# Patient Record
Sex: Male | Born: 1965 | Race: White | Hispanic: No | Marital: Married | Smoking: Current every day smoker
Health system: Southern US, Community
[De-identification: ages and names within clinical notes are randomized; demographics above are authoritative.]

## PROBLEM LIST (undated history)

## (undated) DIAGNOSIS — Z8679 Personal history of other diseases of the circulatory system: Secondary | ICD-10-CM

## (undated) DIAGNOSIS — Q752 Hypertelorism: Secondary | ICD-10-CM

## (undated) DIAGNOSIS — R079 Chest pain, unspecified: Secondary | ICD-10-CM

## (undated) DIAGNOSIS — R9431 Abnormal electrocardiogram [ECG] [EKG]: Secondary | ICD-10-CM

## (undated) DIAGNOSIS — F17200 Nicotine dependence, unspecified, uncomplicated: Secondary | ICD-10-CM

## (undated) DIAGNOSIS — R238 Other skin changes: Secondary | ICD-10-CM

## (undated) DIAGNOSIS — E785 Hyperlipidemia, unspecified: Secondary | ICD-10-CM

## (undated) HISTORY — PX: CARDIAC CATHETERIZATION: SHX172

## (undated) HISTORY — DX: Hypertelorism: Q75.2

## (undated) HISTORY — DX: Other skin changes: R23.8

## (undated) HISTORY — DX: Abnormal electrocardiogram (ECG) (EKG): R94.31

## (undated) HISTORY — DX: Chest pain, unspecified: R07.9

## (undated) HISTORY — DX: Hyperlipidemia, unspecified: E78.5

## (undated) HISTORY — DX: Personal history of other diseases of the circulatory system: Z86.79

## (undated) HISTORY — DX: Nicotine dependence, unspecified, uncomplicated: F17.200

---

## 2012-11-11 ENCOUNTER — Other Ambulatory Visit (HOSPITAL_COMMUNITY): Payer: Self-pay | Admitting: Internal Medicine

## 2012-11-11 DIAGNOSIS — I25810 Atherosclerosis of coronary artery bypass graft(s) without angina pectoris: Secondary | ICD-10-CM

## 2012-11-11 DIAGNOSIS — I10 Essential (primary) hypertension: Secondary | ICD-10-CM

## 2012-11-11 DIAGNOSIS — R079 Chest pain, unspecified: Secondary | ICD-10-CM

## 2012-11-21 ENCOUNTER — Ambulatory Visit (HOSPITAL_COMMUNITY)
Admission: RE | Admit: 2012-11-21 | Discharge: 2012-11-21 | Disposition: A | Discharge status: Home / Self Care | Payer: BC Managed Care – PPO | Source: Ambulatory Visit | Attending: Internal Medicine | Admitting: Internal Medicine

## 2012-11-21 DIAGNOSIS — I2510 Atherosclerotic heart disease of native coronary artery without angina pectoris: Secondary | ICD-10-CM | POA: Insufficient documentation

## 2012-11-21 DIAGNOSIS — R079 Chest pain, unspecified: Secondary | ICD-10-CM

## 2012-11-21 DIAGNOSIS — I252 Old myocardial infarction: Secondary | ICD-10-CM | POA: Insufficient documentation

## 2012-11-21 DIAGNOSIS — I25810 Atherosclerosis of coronary artery bypass graft(s) without angina pectoris: Secondary | ICD-10-CM

## 2012-11-21 DIAGNOSIS — R5381 Other malaise: Secondary | ICD-10-CM | POA: Insufficient documentation

## 2012-11-21 DIAGNOSIS — I10 Essential (primary) hypertension: Secondary | ICD-10-CM

## 2012-11-21 DIAGNOSIS — Z9861 Coronary angioplasty status: Secondary | ICD-10-CM | POA: Insufficient documentation

## 2012-11-21 MED ORDER — REGADENOSON 0.4 MG/5ML IV SOLN
0.4000 mg | Freq: Once | INTRAVENOUS | Status: AC
  Administered 2012-11-21: 0.4 mg via INTRAVENOUS

## 2012-11-21 MED ORDER — TECHNETIUM TC 99M SESTAMIBI GENERIC - CARDIOLITE
30.7000 | Freq: Once | INTRAVENOUS | Status: AC | PRN
  Administered 2012-11-21: 30.7 via INTRAVENOUS

## 2012-11-21 MED ORDER — TECHNETIUM TC 99M SESTAMIBI GENERIC - CARDIOLITE
10.2000 | Freq: Once | INTRAVENOUS | Status: AC | PRN
  Administered 2012-11-21: 10 via INTRAVENOUS

## 2012-11-21 NOTE — Procedures (Addendum)
 [facility] [address] [place] [address] [phone]  Cardiology Nuclear Med Study  Rodney Acosta is a 47 y.o. male     MRN : [id number]     DOB: 05/19/1966  Procedure Date: 11/21/2012  Nuclear Med Background Indication for Stress Test:  Stent Patency History:  CAD;MI-2002;STENT/PTCS-2002;CARDIOMYOPATHY Cardiac Risk Factors: Family History - CAD, Hypertension, Lipids, Overweight and Smoker  Symptoms:  Chest Pain and Fatigue   Nuclear Pre-Procedure Caffeine/Decaff Intake:  10:00pm NPO After: 8:00am   IV Site: R Hand  IV 0.9% NS with Angio Cath:  22g  Chest Size (in):  46" IV Started by: Emmit Pomfret, RN  Height: 5\' 10"  (1.778 m)  Cup Size: n/a  BMI:  Body mass index is 33.72 kg/(m^2). Weight:  235 lb (106.595 kg)   Tech Comments:  N/A    Nuclear Med Study 1 or 2 day study: 1 day  Stress Test Type:  Lexiscan  Order Authorizing Provider:  KENNETH [place] HILTY, MD   Resting Radionuclide: Technetium 75m Sestamibi  Resting Radionuclide Dose: 10.2 mCi   Stress Radionuclide:  Technetium 35m Sestamibi  Stress Radionuclide Dose: 30.7 mCi           Stress Protocol Rest HR: 88 Stress HR: 104  Rest BP: 137/86 Stress BP: 142/86  Exercise Time (min): n/a METS: n/a   Predicted Max HR: 172 bpm % Max HR: 51.16 bpm Rate Pressure Product: 41324  Dose of Adenosine (mg):  n/a Dose of Lexiscan: 0.4 mg  Dose of Atropine (mg): n/a Dose of Dobutamine: n/a mcg/kg/min (at max HR)  Stress Test Technologist: Esperanza Sheets, CCT Nuclear Technologist: Gonzella Lex, CNMT   Rest Procedure:  Myocardial perfusion imaging was performed at rest 45 minutes following the intravenous administration of Technetium 51m Sestamibi. Stress Procedure:  The patient received IV Lexiscan 0.4 mg over 15-seconds.  Technetium 30m Sestamibi injected at 30-seconds.  There were no significant changes with Lexiscan.  Quantitative spect images were obtained  after a 45 minute delay.  Transient Ischemic Dilatation (Normal <1.22):  1.14 Lung/Heart Ratio (Normal <0.45):  0.28 QGS EDV:  n/a ml QGS ESV:  n/a ml LV Ejection Fraction: Study not gated  Signed by       Rest ECG: NSR, frequent monomorphic PVCs.  Stress ECG: No significant change from baseline ECG  QPS Raw Data Images:  Normal; no motion artifact; normal heart/lung ratio. Stress Images:  There is decreased uptake in the inferior wall. Rest Images:  Minimal improvement in inferior wall uptake. Subtraction (SDS):  There is a fixed defect that is most consistent with a previous infarction.  Impression Exercise Capacity:  Lexiscan with no exercise. BP Response:  Normal blood pressure response. Clinical Symptoms:  No significant symptoms noted. ECG Impression:  No significant ST segment change suggestive of ischemia. Comparison with Prior Nuclear Study: No previous nuclear study performed  Overall Impression:  Low risk stress nuclear study. In the absence of gated images, cnnot exclude diaphragmatic attenuation artifact, but the images suggest true nontransmural scar in the RCA territory.  LV Wall Motion:  The study was not gated due to ectopy.   Thurmon Fair, MD  11/21/2012 6:48 PM

## 2014-03-10 ENCOUNTER — Other Ambulatory Visit: Payer: Self-pay

## 2014-03-10 ENCOUNTER — Ambulatory Visit (INDEPENDENT_AMBULATORY_CARE_PROVIDER_SITE_OTHER): Payer: BC Managed Care – PPO | Admitting: Cardiology

## 2014-03-10 ENCOUNTER — Encounter: Payer: Self-pay | Admitting: Cardiology

## 2014-03-10 VITALS — BP 130/70 | HR 99 | Ht 70.0 in | Wt 223.0 lb

## 2014-03-10 DIAGNOSIS — Z01810 Encounter for preprocedural cardiovascular examination: Secondary | ICD-10-CM | POA: Insufficient documentation

## 2014-03-10 DIAGNOSIS — Z720 Tobacco use: Secondary | ICD-10-CM

## 2014-03-10 DIAGNOSIS — I10 Essential (primary) hypertension: Secondary | ICD-10-CM

## 2014-03-10 DIAGNOSIS — I2510 Atherosclerotic heart disease of native coronary artery without angina pectoris: Secondary | ICD-10-CM | POA: Insufficient documentation

## 2014-03-10 DIAGNOSIS — F17200 Nicotine dependence, unspecified, uncomplicated: Secondary | ICD-10-CM

## 2014-03-10 MED ORDER — ASPIRIN EC 81 MG PO TBEC
81.0000 mg | DELAYED_RELEASE_TABLET | Freq: Every day | ORAL | Status: AC
Start: 2014-03-10 — End: ?

## 2014-03-10 MED ORDER — METOPROLOL SUCCINATE ER 25 MG PO TB24: 25.0000 mg | ORAL_TABLET | Freq: Every day | ORAL | Status: AC

## 2014-03-10 NOTE — Patient Instructions (Signed)
Start taking Toprol XL 25 mg once daily. Take Asprin, 81 mg once daily. Return in 6 months for follow-up with Dr. Rennis GoldenHilty.

## 2014-03-10 NOTE — Progress Notes (Signed)
Patient ID: Rodney Acosta, male   DOB: 07/12/1966, 48 y.o.   MRN: [id number]    03/10/2014 Rodney Acosta   03/15/1966  [id number]  Primary Physicia STEVENS, Lambert KetoLISA LAJUANA, PA Primary Cardiologist: Dr. Rennis GoldenHilty  HPI:  Mr. Rodney Acosta presents to clinic today for preoperative clearance. He is tentatively scheduled to undergo an elective ventral hernia repair [facility].   He is a 48 year old male, followed by Dr. Rennis GoldenHilty. He has a history of known CAD, ongoing tobacco abuse (smokes 1.5 ppd), hypertension and hyperlipidemia. In 2002, he suffered an inferior ST elevation MI, due to a mid and distal RCA occlusion, successfully treated with PCI and stenting. He had a nuclear stress test in March of 2014 which demonstrated no ischemia. He denies any recurrent anginal symptoms. 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 He states he can perform his work duties without limitation. He states that he is able to walk a flight of stairs without exertional angina or dyspnea. He denies dizziness, lightheadedness, palpitations, syncope/near-syncope, lower extremity edema, orthopnea or PND. Unfortunately, he continues to smoke daily. He also reports that he has not been compliant with his medications. He stopped taking his metoprolol, as he felt like he no longer needed it. He states that he gets regular checkups at his place of employment. There is a PA/NP that is available on a weekly basis for routine checkups. The providers at his place of employment serve as his PCP. He states that they monitor his cholesterol and blood pressure on a regular basis. He states that both his blood pressure and cholesterol have been well controlled. He has also been screened for diabetes and denies any past diagnosis of this.  His EKG today demonstrates normal sinus rhythm without ischemic changes. Heart rate is 99 beats per minute. His blood pressure is controlled at  130/90.  Current Outpatient Prescriptions  Medication Sig Dispense Refill  . aspirin EC 81 MG tablet Take 1 tablet (81 mg total) by mouth daily.  90 tablet  3  . metoprolol succinate (TOPROL XL) 25 MG 24 hr tablet Take 1 tablet (25 mg total) by mouth daily.  30 tablet  5   No current facility-administered medications for this visit.    No Known Allergies  History   Social History  . Marital Status: Married    Spouse Name: N/A    Number of Children: N/A  . Years of Education: N/A   Occupational History  . Not on file.   Social History Main Topics  . Smoking status: Current Every Day Smoker  . Smokeless tobacco: Not on file  . Alcohol Use: Yes  . Drug Use: No  . Sexual Activity: Not on file   Other Topics Concern  . Not on file   Social History Narrative  . No narrative on file     Review of Systems: General: negative for chills, fever, night sweats or weight changes.  Cardiovascular: negative for chest pain, dyspnea on exertion, edema, orthopnea, palpitations, paroxysmal nocturnal dyspnea or shortness of breath Dermatological: negative for rash Respiratory: negative for cough or wheezing Urologic: negative for hematuria Abdominal: negative for nausea, vomiting, diarrhea, bright red blood per rectum, melena, or hematemesis Neurologic: negative for visual changes, syncope, or dizziness All other systems reviewed and are otherwise negative except as noted above.    Blood pressure 130/70, pulse 99, height 5\' 10"  (1.778 m), weight 223 lb (101.152 kg).  General appearance: alert, cooperative  and no distress Neck: no carotid bruit and no JVD Lungs: clear to auscultation bilaterally Heart: regular rate and rhythm, S1, S2 normal, no murmur, click, rub or gallop Extremities: no LEE Pulses: 2+ and symmetric Skin: warm and dry Neurologic: Grossly normal  EKG: normal sinus rhythm. Ventricular rate 99 beats per minute.  ASSESSMENT AND PLAN:   1. CAD: Status post  inferior STEMI in 2002 resulting in PCI plus stenting of the RCA. Last nuclear stress test was in March 2014 which was nonischemic. His EKG today demonstrates normal sinus rhythm without any ischemic abnormalities. He has  not had any anginal symptoms or shortness of breath and he is able to perform at least 4 METs without limitations. He has been instructed to continue daily low dose aspirin, however will leave decision to his surgeon in regards to whether or not it should be continued during the perioperative period. As his heart rate is elevated and he has a history of prior MI, will restart him on his beta blocker. Will initiate 25 mg of Toprol XL. Smoking cessation was strongly advised.  2. Hypertension: Blood pressure is controlled at 130/90. He is currently not on a beta blocker. Will start 25 mg of Toprol XL, to be taken once daily.  3. Hyperlipidemia: This is followed by his PCP.  4. Chronic tobacco abuse: He smokes on average 1.5 ppd. Smoking cessation was strongly advised   PLAN: I have discussed case with Dr. Rennis Golden. He has reviewed his charts and EKG. He appears stable from a cardiac standpoint. As he has not had any recurrent anginal symptoms and is able to perform at least 4 METs without limitation, he has been cleared to undergo elective ventral hernia repair. It was recommended that we restart him a beta blocker. This was e-prescribed to his pharmacy. He is also to resume aspirin, however this may be temporarily held for surgery.  Rodney Acosta, BRITTAINYPA-C 03/10/2014 5:13 PM

## 2014-03-25 ENCOUNTER — Ambulatory Visit: Payer: BC Managed Care – PPO | Admitting: Cardiology

## 2015-04-16 ENCOUNTER — Encounter: Payer: Self-pay | Admitting: Internal Medicine

## 2015-05-23 NOTE — Progress Notes (Signed)
Cardiology Office Note   Date:  05/24/2015   ID:  Rodney Acosta, DOB Dec 07, 1965, MRN [id number]  PCP:  Dow Adolph, PA  Cardiologist:   Madilyn Hook, MD   Chief Complaint  Patient presents with  . New Evaluation    abnormal elk/SOB and sweaty      History of Present Illness: Rodney Acosta is a 49 y.o. male with CAD s/p MI, hypertension, hyperlipidemia and tobacco abuse who presents for an evaluation of chest pain.  At the end of September  He saw the PA at his work and reported intermittent episodes of chest pain.  These episodes occur when laying in the [place] and last for a few seconds.  There is no associated shortness of breath, nausea or diaphoresis.  His PA referred him to urgent care where an EKG was performed and was reportedly abnormal. From there he was referred to the [facility] ER and had two sets of negative cardiac enzymes.  He was discharged from the ED and it was recommended that he get a stress test as an outpatient.  Since then he has not had any recurrent episodes.  Rodney Acosta also reports dizziness, shortness of breath and diaphoresis for the last 2 days. This occurred at rest. It did not get worse with physical activity such as walking. His wife associates these symptoms with him working third shift. She states these only happen when he is working this on shift. When he works third shift it is difficult for him to sleep.  Rodney Acosta most strenuous activity as yardwork. He does not get any chest pain or shortness of breath with this activity. He denies a orthopnea, lower extremity edema, PND, palpitations, lightheadedness, or dizziness. He notes that his diet is poor. He does not eat any breakfast and only has small snack for lunch. He drinks coffee and soda occasionally throughout the day. He does not drink any water. He continues to smoke one pack per day. He was previously able to quit smoking with the help of Chantix. That lasted for 2  years. However he started gaining weight and decided to start back smoking.  Rodney Acosta had a Lexiscan Myoview in 11/2012 that was negative for ischemia.  Past Medical History  Diagnosis Date  . ST segment elevation     myocardial infarction  . Hyperlineal palms   . Hypertelorism   . History of ischemic cardiomyopathy     EF of 40%  . Tobacco dependence   . Chest pain     Relieved with Nitroglycerin  . Dyslipidemia     Past Surgical History  Procedure Laterality Date  . Cardiac catheterization       Current Outpatient Prescriptions  Medication Sig Dispense Refill  . aspirin EC 81 MG tablet Take 1 tablet (81 mg total) by mouth daily. 90 tablet 3  . cyclobenzaprine (FLEXERIL) 5 MG tablet Take 5 mg by mouth as needed.  1  . metoprolol succinate (TOPROL XL) 25 MG 24 hr tablet Take 1 tablet (25 mg total) by mouth daily. 30 tablet 5  . nitroGLYCERIN (NITRODUR - DOSED IN MG/24 HR) 0.4 mg/hr patch Place 1 patch onto the skin.    Marland Kitchen nitroGLYCERIN (NITROSTAT) 0.4 MG SL tablet Place 0.4 mg under the tongue See admin instructions.  0  . rosuvastatin (CRESTOR) 20 MG tablet Take 20 mg by mouth daily.     No current facility-administered medications for this visit.    Allergies:   Review  of patient's allergies indicates no known allergies.    Social History:  The patient  reports that he has been smoking.  He does not have any smokeless tobacco history on file. He reports that he drinks alcohol. He reports that he does not use illicit drugs.   Family History:  The patient's family history includes Cancer in his sister; Heart attack in his father and maternal grandmother; Heart attack (age of onset: 43) in his brother.    ROS:  Please see the history of present illness.   Otherwise, review of systems are positive for none.   All other systems are reviewed and negative.    PHYSICAL EXAM: VS:  BP 136/88 mmHg  Pulse 86  Ht  (1.778 m)  Wt 104.327 kg (230 lb)  BMI 33.00 kg/m2   SpO2 96% , BMI Body mass index is 33 kg/(m^2). GENERAL:  Well appearing HEENT:  Pupils equal round and reactive, fundi not visualized, oral mucosa unremarkable NECK:  No jugular venous distention, waveform within normal limits, carotid upstroke brisk and symmetric, no bruits, no thyromegaly LYMPHATICS:  No cervical adenopathy LUNGS:  Clear to auscultation bilaterally HEART:  RRR.  PMI not displaced or sustained,S1 and S2 within normal limits, no S3, no S4, no clicks, no rubs, no murmurs ABD:  Flat, positive bowel sounds normal in frequency in pitch, no bruits, no rebound, no guarding, no midline pulsatile mass, no hepatomegaly, no splenomegaly EXT:  2 plus pulses throughout, no edema, no cyanosis no clubbing SKIN:  No rashes no nodules NEURO:  Cranial nerves II through XII grossly intact, motor grossly intact throughout PSYCH:  Cognitively intact, oriented to person place and time    EKG:  EKG is ordered today. The ekg ordered today demonstrates sinus rhythm at 83 bpm.  Non-specific t wave changes.     Recent Labs: No results found for requested labs within last 365 days.    Lipid Panel No results found for: CHOL, TRIG, HDL, CHOLHDL, VLDL, LDLCALC, LDLDIRECT    Wt Readings from Last 3 Encounters:  05/24/15 104.327 kg (230 lb)  03/10/14 101.152 kg (223 lb)  11/21/12 106.595 kg (235 lb)      Other studies Reviewed: Additional studies/ records that were reviewed today include: . Review of the above records demonstrates:  Please see elsewhere in the note.     ASSESSMENT AND PLAN:  # Atypical chest pain:  Symptoms are very atypical. However he has a history of coronary artery disease. We will refer him for exercise Cardiolite. He will continue aspirin, metoprolol and prn nitroglycerin.  # Hypertension: BP well-controlled.  COntinue metorpolol.  # Hyperlipidemia: Continue rosuvastatin.  Managed by PCP.  # Tobacco abuse: Rodney Acosta states that he is not ready to stop  smoking at this time.  He will consider using Chantix again one day.  # Obesity:  We discussed the need for 30-40 minutes of aerobic activity most days of the week. We also discussed options for improving his diet. Given that he does not eat breakfast or lunch, we recommended that he start his stay with a healthy breakfast. This will help his metabolism in and assist in weight loss. It will also help him to not be so hungry and make poor food decisions later in the day.  Current medicines are reviewed at length with the patient today.  The patient does not have concerns regarding medicines.  The following changes have been made:  no change  Labs/ tests ordered today include:  Orders Placed This Encounter  Procedures  . Myocardial Perfusion Imaging  . EKG 12-Lead     Disposition:   FU with Selestino Nila C. Duke Salvia, MD in 1 year.   Signed, Madilyn Hook, MD  05/24/2015 4:21 PM    [facility]

## 2015-05-24 ENCOUNTER — Ambulatory Visit (INDEPENDENT_AMBULATORY_CARE_PROVIDER_SITE_OTHER): Payer: BLUE CROSS/BLUE SHIELD | Admitting: Cardiovascular Disease

## 2015-05-24 ENCOUNTER — Encounter: Payer: Self-pay | Admitting: Cardiovascular Disease

## 2015-05-24 VITALS — BP 136/88 | HR 86 | Ht 70.0 in | Wt 230.0 lb

## 2015-05-24 DIAGNOSIS — I10 Essential (primary) hypertension: Secondary | ICD-10-CM

## 2015-05-24 DIAGNOSIS — E785 Hyperlipidemia, unspecified: Secondary | ICD-10-CM

## 2015-05-24 DIAGNOSIS — R079 Chest pain, unspecified: Secondary | ICD-10-CM | POA: Diagnosis not present

## 2015-05-24 DIAGNOSIS — I2510 Atherosclerotic heart disease of native coronary artery without angina pectoris: Secondary | ICD-10-CM | POA: Diagnosis not present

## 2015-05-24 DIAGNOSIS — Z720 Tobacco use: Secondary | ICD-10-CM

## 2015-05-24 NOTE — Patient Instructions (Signed)
Your physician has requested that you have en exercise stress myoview. For further information please visit [email or web address] Please follow instruction sheet, as given.  Dr Duke Salvia recommends that you schedule a follow-up appointment in 1 year. You will receive a reminder letter in the mail two months in advance. If you don't receive a letter, please call our office to schedule the follow-up appointment.

## 2015-05-26 ENCOUNTER — Telehealth (HOSPITAL_COMMUNITY): Payer: Self-pay

## 2015-05-26 NOTE — Telephone Encounter (Signed)
Encounter complete. 

## 2015-05-28 ENCOUNTER — Ambulatory Visit (HOSPITAL_COMMUNITY)
Admission: RE | Admit: 2015-05-28 | Discharge: 2015-05-28 | Disposition: A | Discharge status: Home / Self Care | Payer: BLUE CROSS/BLUE SHIELD | Source: Ambulatory Visit | Attending: Cardiovascular Disease | Admitting: Cardiovascular Disease

## 2015-05-28 DIAGNOSIS — R61 Generalized hyperhidrosis: Secondary | ICD-10-CM | POA: Insufficient documentation

## 2015-05-28 DIAGNOSIS — I10 Essential (primary) hypertension: Secondary | ICD-10-CM | POA: Diagnosis not present

## 2015-05-28 DIAGNOSIS — F17200 Nicotine dependence, unspecified, uncomplicated: Secondary | ICD-10-CM | POA: Diagnosis not present

## 2015-05-28 DIAGNOSIS — Z8249 Family history of ischemic heart disease and other diseases of the circulatory system: Secondary | ICD-10-CM | POA: Diagnosis not present

## 2015-05-28 DIAGNOSIS — R0602 Shortness of breath: Secondary | ICD-10-CM | POA: Diagnosis not present

## 2015-05-28 DIAGNOSIS — R079 Chest pain, unspecified: Secondary | ICD-10-CM | POA: Diagnosis not present

## 2015-05-28 DIAGNOSIS — R42 Dizziness and giddiness: Secondary | ICD-10-CM | POA: Diagnosis not present

## 2015-05-28 LAB — MYOCARDIAL PERFUSION IMAGING
CHL CUP NUCLEAR SSS: 4
CHL RATE OF PERCEIVED EXERTION: 17
CSEPED: 9 min
CSEPEW: 10.1 METS
CSEPPHR: 150 {beats}/min
MPHR: 169 {beats}/min
NUC STRESS TID: 1.05
Percent HR: 88 %
Rest HR: 85 {beats}/min
SDS: 0
SRS: 4

## 2015-05-28 MED ORDER — TECHNETIUM TC 99M SESTAMIBI GENERIC - CARDIOLITE
10.8000 | Freq: Once | INTRAVENOUS | Status: AC | PRN
  Administered 2015-05-28: 10.8 via INTRAVENOUS

## 2015-05-28 MED ORDER — TECHNETIUM TC 99M SESTAMIBI GENERIC - CARDIOLITE
30.8000 | Freq: Once | INTRAVENOUS | Status: AC | PRN
  Administered 2015-05-28: 30.8 via INTRAVENOUS

## 2015-05-31 ENCOUNTER — Telehealth: Payer: Self-pay | Admitting: *Deleted

## 2015-05-31 NOTE — Telephone Encounter (Signed)
-----   Message from Chilton Si, MD sent at 05/30/2015 11:04 AM EDT ----- Normal stress test.  Chest pain is not due to abnormal blood flow in the heart arteries.

## 2015-05-31 NOTE — Telephone Encounter (Signed)
LEFT MESSAGE TO CALL BACK

## 2015-05-31 NOTE — Telephone Encounter (Signed)
Spoke to patien'ts wife. Result given . Verbalized understanding  

## 2017-08-27 IMAGING — NM NM MISC PROCEDURE
4 series · 24 of 24 positions shown · non-contrast
Comparison: none

[Series 1: wbr rest · 6.40mm/px · 6 of 64 frames shown]
[frame 6/64]
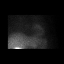
[frame 16/64]
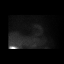
[frame 27/64]
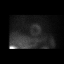
[frame 38/64]
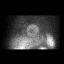
[frame 48/64]
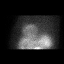
[frame 59/64]
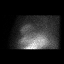

[Series 1: wbr_r-proj_st wbr rest · 6.40mm/px · 6 of 64 frames shown]
[frame 6/64]
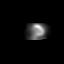
[frame 16/64]
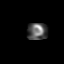
[frame 27/64]
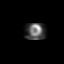
[frame 38/64]
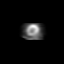
[frame 48/64]
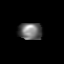
[frame 59/64]
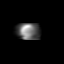

[Series 2: wbr stress ng · 6.40mm/px · 6 of 64 frames shown]
[frame 6/64]
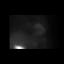
[frame 16/64]
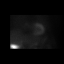
[frame 27/64]
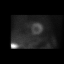
[frame 38/64]
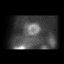
[frame 48/64]
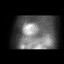
[frame 59/64]
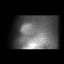

[Series 2: wbr_s-proj_st wbr stress ng · 6.40mm/px · 6 of 64 frames shown]
[frame 6/64]
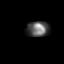
[frame 16/64]
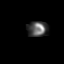
[frame 27/64]
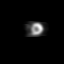
[frame 38/64]
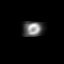
[frame 48/64]
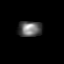
[frame 59/64]
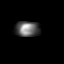

[24 of 24 positions shown; findings below may reference images not displayed]

Canned report from images found in remote index.

Refer to host system for actual result text.
